# Patient Record
Sex: Male | Born: 1980 | Hispanic: Yes | Marital: Single | Smoking: Never smoker
Health system: Southern US, Community
[De-identification: ages and names within clinical notes are randomized; demographics above are authoritative.]

## PROBLEM LIST (undated history)

## (undated) DIAGNOSIS — I10 Essential (primary) hypertension: Secondary | ICD-10-CM

## (undated) DIAGNOSIS — F410 Panic disorder [episodic paroxysmal anxiety] without agoraphobia: Secondary | ICD-10-CM

---

## 2018-04-16 ENCOUNTER — Emergency Department (HOSPITAL_COMMUNITY): Payer: Self-pay

## 2018-04-16 ENCOUNTER — Emergency Department (HOSPITAL_COMMUNITY)
Admission: EM | Admit: 2018-04-16 | Discharge: 2018-04-16 | Disposition: A | Discharge status: Home / Self Care | Payer: Self-pay | Attending: Emergency Medicine | Admitting: Emergency Medicine

## 2018-04-16 ENCOUNTER — Encounter (HOSPITAL_COMMUNITY): Payer: Self-pay | Admitting: Emergency Medicine

## 2018-04-16 DIAGNOSIS — R002 Palpitations: Secondary | ICD-10-CM | POA: Insufficient documentation

## 2018-04-16 LAB — CBC
HEMATOCRIT: 45.8 % (ref 39.0–52.0)
Hemoglobin: 14.8 g/dL (ref 13.0–17.0)
MCH: 29.1 pg (ref 26.0–34.0)
MCHC: 32.3 g/dL (ref 30.0–36.0)
MCV: 90.2 fL (ref 78.0–100.0)
Platelets: 320 10*3/uL (ref 150–400)
RBC: 5.08 MIL/uL (ref 4.22–5.81)
RDW: 12.3 % (ref 11.5–15.5)
WBC: 6.4 10*3/uL (ref 4.0–10.5)

## 2018-04-16 LAB — I-STAT TROPONIN, ED: Troponin i, poc: 0.01 ng/mL (ref 0.00–0.08)

## 2018-04-16 LAB — BASIC METABOLIC PANEL
Anion gap: 8 (ref 5–15)
BUN: 18 mg/dL (ref 6–20)
CO2: 22 mmol/L (ref 22–32)
Calcium: 8.7 mg/dL — ABNORMAL LOW (ref 8.9–10.3)
Chloride: 108 mmol/L (ref 98–111)
Creatinine, Ser: 1.05 mg/dL (ref 0.61–1.24)
GFR calc Af Amer: >60 mL/min (ref >60)
GLUCOSE: 119 mg/dL — AB (ref 70–99)
POTASSIUM: 3.9 mmol/L (ref 3.5–5.1)
Sodium: 138 mmol/L (ref 135–145)

## 2018-04-16 LAB — D-DIMER, QUANTITATIVE: D-Dimer, Quant: 0.39 ug/mL-FEU (ref 0.00–0.50)

## 2018-04-16 MED ORDER — OMEPRAZOLE 20 MG PO CPDR: 20.0000 mg | DELAYED_RELEASE_CAPSULE | Freq: Every day | ORAL | 1 refills | Status: AC

## 2018-04-16 NOTE — Discharge Instructions (Signed)
Su trabajo hoy fue tranquilizador. Le recomendamos que realice un seguimiento de la cardiologa si los sntomas continan. Le han recetado Prilosec. Tome [employer]esto todos los das para ver si ayuda a Paramedicaliviar sus sntomas. Usted puede regresar al ED para cualquier sntoma nuevo o preocupante.  Your work up today was reassuring. We advise that you follow up with cardiology if symptoms continue. You have been prescribed Prilosec. Take this daily to see if it helps alleviate your symptoms. You may return to the ED for any new or concerning symptoms.

## 2018-04-16 NOTE — ED Notes (Signed)
The pt walked to the room and then to the br  No sob  Tolerated well

## 2018-04-16 NOTE — ED Notes (Signed)
The pt had chest pain earlier  bp is high he does not take bp med

## 2018-04-16 NOTE — ED Triage Notes (Signed)
Pt presents with feeling dizzy, sob, and having high BP which he has no known hx of; pt denies CP, LOC, or n/v; pt denies medical problems or surgeries or allergies

## 2018-04-16 NOTE — ED Provider Notes (Signed)
[facility] EMERGENCY DEPARTMENT Provider Note   CSN: [id number] Arrival date & time: 04/16/18  0156    History   Chief Complaint Chief Complaint  Patient presents with  . Dizziness  . Shortness of Breath    HPI Jonathan Mullins is a 37 y.o. male.  The patient is a 37 year old male who presents to the emergency department for evaluation of shortness of breath and palpitations.  He states that he has been experiencing palpitations and shortness of breath waking him from sleep almost every evening.  He does subjectively feel symptoms to be worse after he has had a large meal before bed.  He denies any known alleviating factors of his symptoms.  He feels slightly lightheaded when his symptoms are present, but has not experienced any syncope.  Denies any chest pain, abdominal pain, diaphoresis, vomiting, leg swelling.  No recent surgeries, hospitalizations, travel.  No history of ACS, diabetes, hypertension, dyslipidemia, tobacco use, or family history of ACS.  The history is provided by the patient. A language interpreter was used ([employer]).  Dizziness  Associated symptoms: shortness of breath   Shortness of Breath     History reviewed. No pertinent past medical history.  There are no active problems to display for this patient.   History reviewed. No pertinent surgical history.      Home Medications    Prior to Admission medications   Medication Sig Start Date End Date Taking? Authorizing Provider  omeprazole (PRILOSEC) 20 MG capsule Take 1 capsule (20 mg total) by mouth daily. 04/16/18   Antony Madura, PA-C    Family History History reviewed. No pertinent family history.  Social History Social History   Tobacco Use  . Smoking status: Never Smoker  Substance Use Topics  . Alcohol use: Not Currently  . Drug use: Not Currently     Allergies   Patient has no known allergies.   Review of Systems Review of Systems  Respiratory: Positive for  shortness of breath.   Neurological: Positive for dizziness.  Ten systems reviewed and are negative for acute change, except as noted in the HPI.    Physical Exam Updated Vital Signs BP 118/75   Pulse 76   Temp 98.2 F (36.8 C) (Oral)   Resp (!) 21   Ht 5' 8.11" (1.73 m)   SpO2 96%   Physical Exam  Constitutional: He is oriented to person, place, and time. He appears well-developed and well-nourished. No distress.  Nontoxic appearing and in NAD  HENT:  Head: Normocephalic and atraumatic.  Eyes: Conjunctivae and EOM are normal. No scleral icterus.  Neck: Normal range of motion.  Cardiovascular: Normal rate, regular rhythm and intact distal pulses.  Pulmonary/Chest: Effort normal. No stridor. No respiratory distress.  Respirations even and unlabored. SpO2 92-98% while in exam room.  Musculoskeletal: Normal range of motion.  No BLE edema.  Neurological: He is alert and oriented to person, place, and time. He exhibits normal muscle tone. Coordination normal.  Skin: Skin is warm and dry. No rash noted. He is not diaphoretic. No erythema. No pallor.  Psychiatric: He has a normal mood and affect. His behavior is normal.  Nursing note and vitals reviewed.    ED Treatments / Results  Labs (all labs ordered are listed, but only abnormal results are displayed) Labs Reviewed  BASIC METABOLIC PANEL - Abnormal; Notable for the following components:      Result Value   Glucose, Bld 119 (*)    Calcium 8.7 (*)  All other components within normal limits  CBC  D-DIMER, QUANTITATIVE (NOT AT [facility])  I-STAT TROPONIN, ED    EKG EKG Interpretation  Date/Time:  Monday April 16 2018 02:06:10 EDT Ventricular Rate:  90 PR Interval:  146 QRS Duration: 86 QT Interval:  352 QTC Calculation: 430 R Axis:   0 Text Interpretation:  Normal sinus rhythm Inferior Qwaves, likely normal variant Abnormal ECG Confirmed by Geoffery LyonseLo, Douglas ([id number]) on 04/16/2018 5:37:14 AM   Radiology Dg Chest 2  View  Result Date: 04/16/2018 CLINICAL DATA:  Acute onset of dizziness, shortness of breath and high blood pressure. EXAM: CHEST - 2 VIEW COMPARISON:  None. FINDINGS: The lungs are well-aerated and clear. There is no evidence of focal opacification, pleural effusion or pneumothorax. The heart is borderline normal in size. No acute osseous abnormalities are seen. IMPRESSION: No acute cardiopulmonary process seen. Electronically Signed   By: Roanna RaiderJeffery  Chang M.D.   On: 04/16/2018 02:39    Procedures Procedures (including critical care time)  Medications Ordered in ED Medications - No data to display   Initial Impression / Assessment and Plan / ED Course  I have reviewed the triage vital signs and the nursing notes.  Pertinent labs & imaging results that were available during my care of the patient were reviewed by me and considered in my medical decision making (see chart for details).     Patient presents to the emergency department for evaluation of palpitations and SOB.  Symptoms wake the patient from sleep at night regularly.  He has no symptoms during the day or with exertion.  Low suspicion for emergent cardiac etiology given reassuring workup today.  EKG is nonischemic and troponin negative.  Patient has a heart score of 0 consistent with low risk of acute coronary event.  Chest x-ray without evidence of mediastinal widening to suggest dissection.  No pneumothorax, pneumonia, pleural effusion.  Pulmonary embolus further considered; however, patient without tachycardia, tachypnea, dyspnea, hypoxia.  D dimer today is negative.  Patient notes that symptoms are slightly worse after a large meal.  Question whether reflux may be a component to his symptoms.  Have also discussed the possibility of an arrhythmia such as SVT or atrial fibrillation.  The patient has had no evidence of that while in the ED.  Will refer to cardiology as he would benefit from a Holter monitor if symptoms continue.  Return  precautions discussed and provided. Patient discharged in stable condition with no unaddressed concerns.   Final Clinical Impressions(s) / ED Diagnoses   Final diagnoses:  Palpitations    ED Discharge Orders        Ordered    omeprazole (PRILOSEC) 20 MG capsule  Daily     04/16/18 0532       Antony MaduraHumes, Lesslie Mossa, PA-C 04/16/18 0542    Geoffery Lyonselo, Douglas, MD 04/16/18 [phone]

## 2018-05-05 ENCOUNTER — Emergency Department (HOSPITAL_COMMUNITY)
Admission: EM | Admit: 2018-05-05 | Discharge: 2018-05-06 | Disposition: A | Discharge status: Home / Self Care | Payer: Self-pay | Attending: Emergency Medicine | Admitting: Emergency Medicine

## 2018-05-05 ENCOUNTER — Other Ambulatory Visit: Payer: Self-pay

## 2018-05-05 DIAGNOSIS — J351 Hypertrophy of tonsils: Secondary | ICD-10-CM | POA: Insufficient documentation

## 2018-05-05 DIAGNOSIS — R0602 Shortness of breath: Secondary | ICD-10-CM | POA: Insufficient documentation

## 2018-05-05 NOTE — ED Triage Notes (Signed)
Patient c/o swollen tonsils and not being able to breathe.

## 2018-05-06 ENCOUNTER — Emergency Department (HOSPITAL_COMMUNITY): Payer: Self-pay

## 2018-05-06 LAB — CBC
HCT: 48.5 % (ref 39.0–52.0)
Hemoglobin: 15.9 g/dL (ref 13.0–17.0)
MCH: 29.7 pg (ref 26.0–34.0)
MCHC: 32.8 g/dL (ref 30.0–36.0)
MCV: 90.5 fL (ref 78.0–100.0)
PLATELETS: 321 10*3/uL (ref 150–400)
RBC: 5.36 MIL/uL (ref 4.22–5.81)
RDW: 12.5 % (ref 11.5–15.5)
WBC: 7.5 10*3/uL (ref 4.0–10.5)

## 2018-05-06 LAB — BASIC METABOLIC PANEL
Anion gap: 13 (ref 5–15)
BUN: 8 mg/dL (ref 6–20)
CO2: 21 mmol/L — ABNORMAL LOW (ref 22–32)
CREATININE: 0.92 mg/dL (ref 0.61–1.24)
Calcium: 9.3 mg/dL (ref 8.9–10.3)
Chloride: 105 mmol/L (ref 98–111)
GFR calc Af Amer: >60 mL/min (ref >60)
Glucose, Bld: 91 mg/dL (ref 70–99)
POTASSIUM: 3.7 mmol/L (ref 3.5–5.1)
Sodium: 139 mmol/L (ref 135–145)

## 2018-05-06 LAB — D-DIMER, QUANTITATIVE (NOT AT ARMC): D DIMER QUANT: 0.33 ug{FEU}/mL (ref 0.00–0.50)

## 2018-05-06 LAB — GROUP A STREP BY PCR: GROUP A STREP BY PCR: NOT DETECTED

## 2018-05-06 LAB — BRAIN NATRIURETIC PEPTIDE: B Natriuretic Peptide: 29.4 pg/mL (ref 0.0–100.0)

## 2018-05-06 MED ORDER — DEXAMETHASONE SODIUM PHOSPHATE 10 MG/ML IJ SOLN
10.0000 mg | Freq: Once | INTRAMUSCULAR | Status: AC
  Administered 2018-05-06: 10 mg via INTRAMUSCULAR
  Filled 2018-05-06: qty 1

## 2018-05-06 NOTE — ED Provider Notes (Signed)
[facility] Provider [facility]   CSN: [id number] Arrival date & time: 05/05/18  2317     History   Chief Complaint Chief Complaint  Patient presents with  . Sore Throat    HPI Margarito Linerlfredo Rupnow is a 37 y.o. male.  HPI Translation services utilized for this visit.  37 year old male comes in with chief complaint of redness of breath.  Patient has no medical history. He reports that over the past several days, he has been having shortness of breath.  His shortness of breath is typically present at nighttime and wakes him up from his sleep.  Patient denies any associated chest pain.  On occasion patient has felt like he might faint, which got him concerned.  Patient also reports that he has some exertional dyspnea.  Finally, patient also informs us that he feels like his tonsils are enlarged.  Patient does not smoke or use any drugs.  He has no family history of premature CAD or sudden deaths.  Patient's girlfriend states that patient does not snore at nighttime.    No past medical history on file.  There are no active problems to display for this patient.   No past surgical history on file.      Home Medications    Prior to Admission medications   Medication Sig Start Date End Date Taking? Authorizing Provider  omeprazole (PRILOSEC) 20 MG capsule Take 1 capsule (20 mg total) by mouth daily. Patient not taking: Reported on 05/06/2018 04/16/18   Antony MaduraHumes, Kelly, PA-C    Family History No family history on file.  Social History Social History   Tobacco Use  . Smoking status: Never Smoker  Substance Use Topics  . Alcohol use: Not Currently  . Drug use: Not Currently     Allergies   Patient has no known allergies.   Review of Systems Review of Systems  Constitutional: Positive for activity change.  Respiratory: Positive for shortness of breath.   Cardiovascular: Negative for chest pain.  Gastrointestinal: Negative for blood in  stool.  Neurological: Negative for syncope.     Physical Exam Updated Vital Signs BP 107/79   Pulse 61   Temp 98.7 F (37.1 C) (Oral)   Resp (!) 22   Ht 5\' 8"  (1.727 m)   Wt 104.8 kg   SpO2 99%   BMI 35.12 kg/m   Physical Exam  Constitutional: He is oriented to person, place, and time. He appears well-developed.  HENT:  Head: Atraumatic.  Mouth/Throat: No tonsillar abscesses.  Bilateral tonsillar enlargement No exudates, tonsils are not in contact with one another  Neck: Neck supple.  Cardiovascular: Normal rate.  Pulmonary/Chest: Effort normal.  Neurological: He is alert and oriented to person, place, and time.  Skin: Skin is warm.  Nursing note and vitals reviewed.    ED Treatments / Results  Labs (all labs ordered are listed, but only abnormal results are displayed) Labs Reviewed  BASIC METABOLIC PANEL - Abnormal; Notable for the following components:      Result Value   CO2 21 (*)    All other components within normal limits  GROUP A STREP BY PCR  D-DIMER, QUANTITATIVE (NOT AT [facility])  BRAIN NATRIURETIC PEPTIDE  CBC    EKG EKG Interpretation  Date/Time:  Saturday May 05 2018 23:25:35 EDT Ventricular Rate:  91 PR Interval:  138 QRS Duration: 90 QT Interval:  360 QTC Calculation: 442 R Axis:   17 Text Interpretation:  Normal sinus rhythm Normal  ECG infeiror q waves [id number] resolved Confirmed by Derwood Kaplan [phone]) on 05/06/2018 1:00:12 AM   Radiology Dg Chest 2 View  Result Date: 05/06/2018 CLINICAL DATA:  Shortness of breath. EXAM: CHEST - 2 VIEW COMPARISON:  Radiographs 04/16/2018 FINDINGS: The cardiomediastinal contours are normal, heart size upper normal unchanged likely related to degree of inspiration. The lungs are clear. Pulmonary vasculature is normal. No consolidation, pleural effusion, or pneumothorax. No acute osseous abnormalities are seen. IMPRESSION: Negative radiographs of the chest. Electronically Signed   By: Rubye Oaks M.D.    On: 05/06/2018 00:25    Procedures Procedures (including critical care time)  Medications Ordered in ED Medications  dexamethasone (DECADRON) injection 10 mg (10 mg Intramuscular Given 05/06/18 0321)     Initial Impression / Assessment and Plan / ED Course  I have reviewed the triage vital signs and the nursing notes.  Pertinent labs & imaging results that were available during my care of the patient were reviewed by me and considered in my medical decision making (see chart for details).     37 year old male comes in with chief complaint of shortness of breath. He is having orthopnea-like symptoms and also exertional dyspnea.  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  He was seen in the ER early in the month with similar symptoms. BNP and d-dimer are negative.  Hemoglobin is normal. We do notice that his tonsils are enlarged.  I think this might be a mechanical obstruction.  Girlfriend however states that patient does not snore at nighttime.  Plan is for [place] to have patient follow-up with the ENT. If ENT clears the patient then he will see the cardiologist.  Strict ER return precautions have been discussed, and the patient has been asked to be sleeping in a reclined position at night.  Final Clinical Impressions(s) / ED Diagnoses   Final diagnoses:  Tonsillar enlargement  Shortness of breath    ED Discharge Orders    None       Derwood Kaplan, MD 05/06/18 [phone]

## 2018-05-06 NOTE — Discharge Instructions (Addendum)
As discussed, we noticed that he had tonsillar swelling.  Please call the ENT doctors for the earliest appointment. See the cardiologist only if you are cleared by the ENT doctors.  Return to the ER if your symptoms get worse.

## 2018-05-10 ENCOUNTER — Encounter (HOSPITAL_COMMUNITY): Payer: Self-pay | Admitting: Emergency Medicine

## 2018-05-10 ENCOUNTER — Emergency Department (HOSPITAL_COMMUNITY)
Admission: EM | Admit: 2018-05-10 | Discharge: 2018-05-11 | Disposition: A | Discharge status: Home / Self Care | Payer: Self-pay | Attending: Emergency Medicine | Admitting: Emergency Medicine

## 2018-05-10 DIAGNOSIS — R0602 Shortness of breath: Secondary | ICD-10-CM

## 2018-05-10 DIAGNOSIS — J029 Acute pharyngitis, unspecified: Secondary | ICD-10-CM

## 2018-05-10 DIAGNOSIS — Z79899 Other long term (current) drug therapy: Secondary | ICD-10-CM | POA: Insufficient documentation

## 2018-05-10 DIAGNOSIS — Z77098 Contact with and (suspected) exposure to other hazardous, chiefly nonmedicinal, chemicals: Secondary | ICD-10-CM

## 2018-05-10 DIAGNOSIS — K760 Fatty (change of) liver, not elsewhere classified: Secondary | ICD-10-CM | POA: Insufficient documentation

## 2018-05-10 DIAGNOSIS — F419 Anxiety disorder, unspecified: Secondary | ICD-10-CM | POA: Insufficient documentation

## 2018-05-10 LAB — BASIC METABOLIC PANEL
ANION GAP: 11 (ref 5–15)
BUN: 14 mg/dL (ref 6–20)
CHLORIDE: 106 mmol/L (ref 98–111)
CO2: 21 mmol/L — ABNORMAL LOW (ref 22–32)
Calcium: 9.3 mg/dL (ref 8.9–10.3)
Creatinine, Ser: 1.08 mg/dL (ref 0.61–1.24)
Glucose, Bld: 113 mg/dL — ABNORMAL HIGH (ref 70–99)
POTASSIUM: 3.8 mmol/L (ref 3.5–5.1)
SODIUM: 138 mmol/L (ref 135–145)

## 2018-05-10 LAB — CBC
HEMATOCRIT: 48.4 % (ref 39.0–52.0)
Hemoglobin: 16 g/dL (ref 13.0–17.0)
MCH: 29.6 pg (ref 26.0–34.0)
MCHC: 33.1 g/dL (ref 30.0–36.0)
MCV: 89.6 fL (ref 78.0–100.0)
Platelets: 342 10*3/uL (ref 150–400)
RBC: 5.4 MIL/uL (ref 4.22–5.81)
RDW: 12.3 % (ref 11.5–15.5)
WBC: 8.3 10*3/uL (ref 4.0–10.5)

## 2018-05-10 LAB — I-STAT TROPONIN, ED: Troponin i, poc: 0 ng/mL (ref 0.00–0.08)

## 2018-05-10 NOTE — ED Triage Notes (Addendum)
Pt presents to ED for continuing shortness of breath after being seen twice in the ER in the past week.  Patient states he was exposed to some chemicals (ammonia and chlorox) at work approx 1.5 weeks ago, and has been having shortness of breath so severe he can't sleep at night, eat, or walk long distances.  Patient denies chest pain, denies hx of anxiety, denies recent long travel.

## 2018-05-11 ENCOUNTER — Emergency Department (HOSPITAL_COMMUNITY): Payer: Self-pay

## 2018-05-11 LAB — BRAIN NATRIURETIC PEPTIDE: B Natriuretic Peptide: 7.3 pg/mL (ref 0.0–100.0)

## 2018-05-11 MED ORDER — METHYLPREDNISOLONE SODIUM SUCC 125 MG IJ SOLR
125.0000 mg | Freq: Once | INTRAMUSCULAR | Status: AC
  Administered 2018-05-11: 125 mg via INTRAVENOUS
  Filled 2018-05-11: qty 2

## 2018-05-11 MED ORDER — IOPAMIDOL (ISOVUE-370) INJECTION 76%
INTRAVENOUS | Status: AC
  Filled 2018-05-11: qty 100

## 2018-05-11 MED ORDER — LORAZEPAM 1 MG PO TABS
0.5000 mg | ORAL_TABLET | Freq: Once | ORAL | Status: AC
  Administered 2018-05-11: 0.5 mg via ORAL
  Filled 2018-05-11: qty 1

## 2018-05-11 MED ORDER — IOPAMIDOL (ISOVUE-370) INJECTION 76%
100.0000 mL | Freq: Once | INTRAVENOUS | Status: AC | PRN
  Administered 2018-05-11: 100 mL via INTRAVENOUS

## 2018-05-11 MED ORDER — PREDNISONE 20 MG PO TABS: 40.0000 mg | ORAL_TABLET | Freq: Every day | ORAL | 0 refills | Status: AC

## 2018-05-11 MED ORDER — ALBUTEROL SULFATE (2.5 MG/3ML) 0.083% IN NEBU
5.0000 mg | INHALATION_SOLUTION | Freq: Once | RESPIRATORY_TRACT | Status: AC
  Administered 2018-05-11: 5 mg via RESPIRATORY_TRACT

## 2018-05-11 MED ORDER — DIPHENHYDRAMINE HCL 50 MG/ML IJ SOLN
50.0000 mg | Freq: Once | INTRAMUSCULAR | Status: AC
  Administered 2018-05-11: 50 mg via INTRAVENOUS
  Filled 2018-05-11: qty 1

## 2018-05-11 NOTE — Discharge Instructions (Addendum)
1. Medications: Albuterol inhaler as needed for shortness of breath, prednisone 1 tab per day for 5 days, usual home medications 2. Treatment: rest, drink plenty of fluids,  3. Follow Up: Please followup with your ENT doctor today and schedule an appointment with the pulmonologist in 3-5 days; Please return to the ER for new or worsening symptoms   1. Medicamentos: inhalador de albuterol segn sea necesario para la dificultad para respirar, prednisona 1 tableta por da durante 5 das, medicamentos caseros habituales 2. Tratamiento: descansar, beber muchos lquidos, 3. Seguimiento: haga un seguimiento con su otorrinolaringlogo hoy y programe una cita con Veterinary surgeonel neumlogo en 3-5 das; Regrese a la sala de emergencias para Engineer, manufacturingdetectar sntomas nuevos o que empeoren

## 2018-05-11 NOTE — ED Provider Notes (Signed)
[facility] EMERGENCY DEPARTMENT Provider Note   CSN: [id number] Arrival date & time: 05/10/18  2212     History   Chief Complaint Chief Complaint  Patient presents with  . Shortness of Breath    HPI Jonathan Mullins is a 37 y.o. male with a hx of no major medical problems presents to the Emergency Department complaining of gradual, persistent, progressively worsening SOB onset 1.5 weeks ago.  Pt reports he was exposed to Clorox and ammonia at work. Pt reports he is unable to sleep, walk or eat due to the SOB.  Pt reports SOB is worse with laying flat and his breathing is improved with sitting or standing.  Pt reports a little mucous production with his persistent cough.  Pt denies fever, chills, headache, neck pain, chest pain, abd pain, N/V/D, weakness, dizziness, syncope.  He reports a persistent sore throat as well.  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  Pt reports he has been seen 3x for this and has an ENT appointment for tomorrow.  He was given an albuterol MDI to use at home over the last several days, but this has not helped much.     The history is provided by the patient, medical records and the spouse. No language interpreter was used.    History reviewed. No pertinent past medical history.  There are no active problems to display for this patient.   History reviewed. No pertinent surgical history.      Home Medications    Prior to Admission medications   Medication Sig Start Date End Date Taking? Authorizing Provider  omeprazole (PRILOSEC) 20 MG capsule Take 1 capsule (20 mg total) by mouth daily. Patient not taking: Reported on 05/06/2018 04/16/18   Antony Madura, PA-C  predniSONE (DELTASONE) 20 MG tablet Take 2 tablets (40 mg total) by mouth daily. 05/11/18    Maverick Dieudonne, Dahlia Client, PA-C    Family History History reviewed. No pertinent family history.  Social History Social History   Tobacco Use  . Smoking status: Never Smoker  . Smokeless tobacco: Never Used  Substance Use Topics  . Alcohol use: Not Currently  . Drug use: Not Currently     Allergies   Iodine   Review of Systems Review of Systems  Constitutional: Negative for appetite change, diaphoresis, fatigue, fever and unexpected weight change.  HENT: Positive for sore throat and trouble swallowing. Negative for mouth sores.   Eyes: Negative for visual disturbance.  Respiratory: Positive for cough and shortness of breath. Negative for chest tightness and wheezing.   Cardiovascular: Negative for chest pain.  Gastrointestinal: Negative for abdominal pain, constipation, diarrhea, nausea and vomiting.  Endocrine: Negative for polydipsia, polyphagia and polyuria.  Genitourinary: Negative for dysuria, frequency, hematuria and urgency.  Musculoskeletal: Negative for back pain and neck stiffness.  Skin: Negative for rash.  Allergic/Immunologic: Negative for immunocompromised state.  Neurological: Negative for syncope, light-headedness and headaches.  Hematological: Does not bruise/bleed easily.  Psychiatric/Behavioral: Negative for sleep disturbance. The patient is nervous/anxious.      Physical Exam Updated Vital Signs BP 118/85   Pulse 84   Temp 98.2 F (36.8 C) (Oral)   Resp (!) 24   SpO2 92%    Physical Exam  Constitutional: He appears well-developed and well-nourished. No distress.  Awake,  alert, nontoxic appearance  HENT:  Head: Normocephalic and atraumatic.  Right Ear: Tympanic membrane, external ear and ear canal normal.  Left Ear: Tympanic membrane, external ear and ear canal normal.  Nose: Nose normal. No mucosal edema or rhinorrhea.  Mouth/Throat: Uvula is midline and mucous membranes are normal. Mucous membranes are not dry. No trismus in the jaw. No uvula  swelling. Posterior oropharyngeal edema and posterior oropharyngeal erythema present. No oropharyngeal exudate or tonsillar abscesses.  Eyes: Conjunctivae are normal. No scleral icterus.  Neck: Normal range of motion, full passive range of motion without pain and phonation normal. Neck supple. No tracheal tenderness, no spinous process tenderness and no muscular tenderness present. No neck rigidity. No erythema and normal range of motion present. No Brudzinski's sign and no Kernig's sign noted.  Range of motion without pain  No midline or paraspinal tenderness Normal phonation No stridor Handling secretions without difficulty No nuchal rigidity or meningeal signs  Cardiovascular: Normal rate, regular rhythm, normal heart sounds and intact distal pulses.  Pulses:      Radial pulses are 2+ on the right side, and 2+ on the left side.       Posterior tibial pulses are 2+ on the right side, and 2+ on the left side.  Pulmonary/Chest: Effort normal and breath sounds normal. No stridor. No respiratory distress. He has no decreased breath sounds. He has no wheezes.  Equal chest expansion, clear and equal breath sounds without focal wheezes, rhonchi or rales  Abdominal: Soft. Bowel sounds are normal. He exhibits no mass. There is no tenderness. There is no rebound and no guarding.  Musculoskeletal: Normal range of motion. He exhibits no edema.  No calf swelling or tenderness  Lymphadenopathy:       Head (right side): No submental, no submandibular, no tonsillar, no preauricular, no posterior auricular and no occipital adenopathy present.       Head (left side): No submental, no submandibular, no tonsillar, no preauricular, no posterior auricular and no occipital adenopathy present.    He has no cervical adenopathy.       Right cervical: No superficial cervical, no deep cervical and no posterior cervical adenopathy present.      Left cervical: No superficial cervical, no deep cervical and no posterior  cervical adenopathy present.  Neurological: He is alert.  Alert and oriented Moves all extremities without ataxia  Skin: Skin is warm and dry. He is not diaphoretic.  Psychiatric: His mood appears anxious.  Pt is clearly very anxious  Nursing note and vitals reviewed.    ED Treatments / Results  Labs (all labs ordered are listed, but only abnormal results are displayed) Labs Reviewed  BASIC METABOLIC PANEL - Abnormal; Notable for the following components:      Result Value   CO2 21 (*)    Glucose, Bld 113 (*)    All other components within normal limits  CBC  BRAIN NATRIURETIC PEPTIDE  I-STAT TROPONIN, ED    EKG EKG Interpretation  Date/Time:  Thursday May 10 2018 22:25:58 EDT Ventricular Rate:  79 PR Interval:  146 QRS Duration: 84 QT Interval:  382 QTC Calculation: 438 R Axis:   -12 Text Interpretation:  Normal sinus rhythm Normal ECG Confirmed by Zadie Rhine ([id number]) on 05/11/2018 12:19:13 AM   Radiology Ct Soft Tissue Neck W Contrast  Result Date: 05/11/2018 CLINICAL DATA:  37 y/o M; tonsillar swelling, shortness of breath, difficulty swallowing. Chemical exposure 1-1/2 weeks ago. EXAM: CT NECK WITH CONTRAST TECHNIQUE: Multidetector  CT imaging of the neck was performed using the standard protocol following the bolus administration of intravenous contrast. CONTRAST:  100 cc Isovue 370 COMPARISON:  None. FINDINGS: Pharynx and larynx: Normal. No mass or swelling. Salivary glands: No inflammation, mass, or stone. Thyroid: Normal. Lymph nodes: None enlarged or abnormal density. Vascular: Negative. Limited intracranial: Negative. Visualized orbits: Negative. Mastoids and visualized paranasal sinuses: Mild maxillary sinus mucosal thickening. Additional visualized paranasal sinuses and the mastoid air cells are normally aerated. Skeleton: No acute or aggressive process. Upper chest: Please refer to the concurrent CT angiogram of the chest for further evaluation of lungs and  mediastinum. Other: None. IMPRESSION: Mild maxillary sinus mucosal thickening. Otherwise unremarkable CT of the neck. Electronically Signed   By: Mitzi Hansen M.D.   On: 05/11/2018 02:25   Ct Angio Chest Pe W And/or Wo Contrast  Result Date: 05/11/2018 CLINICAL DATA:  37 year old male with shortness of breath. EXAM: CT ANGIOGRAPHY CHEST WITH CONTRAST TECHNIQUE: Multidetector CT imaging of the chest was performed using the standard protocol during bolus administration of intravenous contrast. Multiplanar CT image reconstructions and MIPs were obtained to evaluate the vascular anatomy. CONTRAST:  ISOVUE-370 IOPAMIDOL (ISOVUE-370) INJECTION 76% COMPARISON:  Chest radiograph dated 05/06/2018 FINDINGS: Cardiovascular: Borderline cardiomegaly. No pericardial effusion. The thoracic aorta is unremarkable. There is no CT evidence of pulmonary embolism. Mediastinum/Nodes: No hilar or mediastinal adenopathy. Esophagus and the thyroid gland are grossly unremarkable. No mediastinal fluid collection. Lungs/Pleura: There is a 4 mm right middle lobe subpleural nodule (series 6, image 66) additional 4 mm subpleural nodule noted in the right lower lobe. The lungs are otherwise clear. There is no pleural effusion or pneumothorax. The central airways are patent. Upper Abdomen: Diffuse fatty infiltration of the liver. The visualized upper abdomen is otherwise unremarkable. Musculoskeletal: No chest wall abnormality. No acute or significant osseous findings. Review of the MIP images confirms the above findings. IMPRESSION: 1. No acute intrathoracic pathology. No CT evidence of pulmonary embolism. 2. Fatty liver. 3. Pulmonary subpleural nodules measure up to 4 mm. No follow-up needed if patient is low-risk (and has no known or suspected primary neoplasm). Non-contrast chest CT can be considered in 12 months if patient is high-risk. This recommendation follows the consensus statement: Guidelines for Management of  Incidental Pulmonary Nodules Detected on CT Images: From the Fleischner Society 2017; Radiology 2017; 284:228-243. Electronically Signed   By: Elgie Collard M.D.   On: 05/11/2018 02:19    Procedures Procedures (including critical care time)  Medications Ordered in ED Medications  iopamidol (ISOVUE-370) 76 % injection (has no administration in time range)  LORazepam (ATIVAN) tablet 0.5 mg (has no administration in time range)  albuterol (PROVENTIL) (2.5 MG/3ML) 0.083% nebulizer solution 5 mg (5 mg Nebulization Given 05/11/18 0127)  iopamidol (ISOVUE-370) 76 % injection 100 mL (100 mLs Intravenous Contrast Given 05/11/18 0150)  diphenhydrAMINE (BENADRYL) injection 50 mg (50 mg Intravenous Given 05/11/18 0216)  methylPREDNISolone sodium succinate (SOLU-MEDROL) 125 mg/2 mL injection 125 mg (125 mg Intravenous Given 05/11/18 0216)     Initial Impression / Assessment and Plan / ED Course  I have reviewed the triage vital signs and the nursing notes.  Pertinent labs & imaging results that were available during my care of the patient were reviewed by me and considered in my medical decision making (see chart for details).  Clinical Course as of May 12 519  [facility] May 11, 2018  4098 No Leukocytosis  WBC: 8.3 [HM]  0515 Negative troponin  Troponin  i, poc: 0.00 [HM]  0515 Normal BNP  B Natriuretic Peptide: 7.3 [HM]  0515 No tachycardia  Pulse Rate: 89 [HM]  0515 No hypoxia  SpO2: 96 % [HM]    Clinical Course User Index [HM] Aayden Cefalu, Dahlia ClientHannah, PA-C    Patient presents with complaints of shortness of breath.  Record review shows that he has been evaluated 3 times in the last week and a half for similar symptoms.  On clinical exam he does have some irritation and mild edema to his tonsils but no exudate.  No stridor.  He is handling secretions without difficulty.  He is without tachycardia, hypotension or hypoxia.  Lung sounds are clear and equal without wheezing.  It is very anxious,  stating that he cannot breathe.  He was given an albuterol nebulizer with decreased anxiety.  His lung sounds remain clear afterwards.  He was additionally given Solu-Medrol.  CT scan of his neck and chest are without acute abnormalities.  There is no evidence of retropharyngeal abscess, peritonsillar abscess, pulmonary embolism, pulmonary edema, pneumonitis or pleural effusion.    Labs are reassuring.  No leukocytosis to suggest infection.  Patient has been largely sedentary at home over the last few days but is without tachycardia.  Less likely to be pulmonary embolism but will obtain a CT scan.  No elevation in BNP to suggest heart failure.  Chest x-rays over the last week have been without pulmonary edema or evidence of pneumonia.  5:17 AM He remains well-appearing without respiratory distress.  No accessory muscle usage.  Lung sounds remain clear and equal area and patient remains without hypoxia.  I have watched patient ambulate here in the emergency department without respiratory distress.  Long discussion with patient and wife.  He is very anxious about feeling short of breath.  He currently has an appointment with ear nose and throat earlier today.  I will refer him to neurology for further evaluation as well.  Patient's wife request that patient be given a prescription for medicine to help him sleep as she believes this is anxiety.  Patient has been given a very small dose of Ativan here in the emergency department but I will not prescribe this to go home with.  Additionally, he will be discharged home with his already prescribed inhaler and I will give a 5-day burst of prednisone.  Gust reasons to return immediately to the emergency department.  Patient and wife state understanding and are in agreement with this plan.  The patient was discussed with and seen by Dr. Bebe ShaggyWickline who agrees with the treatment plan.   Final Clinical Impressions(s) / ED Diagnoses   Final diagnoses:  Shortness  of breath  Sore throat  Chemical exposure    ED Discharge Orders         Ordered    predniSONE (DELTASONE) 20 MG tablet  Daily     05/11/18 0509           Devonte Migues, Dahlia ClientHannah, PA-C 05/11/18 [id number]    Zadie RhineWickline, Donald, MD 05/11/18 [phone]

## 2018-05-11 NOTE — ED Notes (Signed)
Patient transported to X-ray 

## 2018-06-20 ENCOUNTER — Other Ambulatory Visit (HOSPITAL_BASED_OUTPATIENT_CLINIC_OR_DEPARTMENT_OTHER): Payer: Self-pay

## 2018-06-20 DIAGNOSIS — R0683 Snoring: Secondary | ICD-10-CM

## 2018-06-29 ENCOUNTER — Ambulatory Visit (HOSPITAL_BASED_OUTPATIENT_CLINIC_OR_DEPARTMENT_OTHER): Payer: Self-pay | Attending: Otolaryngology

## 2018-10-09 ENCOUNTER — Encounter (HOSPITAL_COMMUNITY): Payer: Self-pay | Admitting: Emergency Medicine

## 2018-10-09 ENCOUNTER — Other Ambulatory Visit: Payer: Self-pay

## 2018-10-09 ENCOUNTER — Emergency Department (HOSPITAL_COMMUNITY)
Admission: EM | Admit: 2018-10-09 | Discharge: 2018-10-10 | Disposition: A | Discharge status: Home / Self Care | Payer: PRIVATE HEALTH INSURANCE | Attending: Emergency Medicine | Admitting: Emergency Medicine

## 2018-10-09 DIAGNOSIS — R0981 Nasal congestion: Secondary | ICD-10-CM | POA: Diagnosis not present

## 2018-10-09 DIAGNOSIS — J020 Streptococcal pharyngitis: Secondary | ICD-10-CM | POA: Insufficient documentation

## 2018-10-09 DIAGNOSIS — R05 Cough: Secondary | ICD-10-CM | POA: Diagnosis present

## 2018-10-09 DIAGNOSIS — R059 Cough, unspecified: Secondary | ICD-10-CM

## 2018-10-09 NOTE — ED Triage Notes (Signed)
C/o generalized body aches, cough, nasal congestion, sore throat, and fever x 2 days.

## 2018-10-10 ENCOUNTER — Emergency Department (HOSPITAL_COMMUNITY): Payer: PRIVATE HEALTH INSURANCE

## 2018-10-10 LAB — GROUP A STREP BY PCR: Group A Strep by PCR: DETECTED — AB

## 2018-10-10 MED ORDER — PENICILLIN G BENZATHINE 1200000 UNIT/2ML IM SUSP
1.2000 10*6.[IU] | Freq: Once | INTRAMUSCULAR | Status: AC
  Administered 2018-10-10: 1.2 10*6.[IU] via INTRAMUSCULAR
  Filled 2018-10-10: qty 2

## 2018-10-10 MED ORDER — BENZONATATE 100 MG PO CAPS: 100.0000 mg | ORAL_CAPSULE | Freq: Three times a day (TID) | ORAL | 0 refills | Status: AC

## 2018-10-10 MED ORDER — FLUTICASONE PROPIONATE 50 MCG/ACT NA SUSP: 1.0000 | Freq: Every day | NASAL | 2 refills | Status: AC

## 2018-10-10 NOTE — ED Notes (Signed)
Patient verbalizes understanding of discharge instructions. Opportunity for questioning and answers were provided. Armband removed by staff, pt discharged from ED ambulatory.   

## 2018-10-10 NOTE — Discharge Instructions (Signed)
You have been treated for strep throat.  Take the prescribed medication as directed. Follow-up with your primary care doctor. Return to the ED for new or worsening symptoms.

## 2018-10-10 NOTE — ED Provider Notes (Signed)
[facility] EMERGENCY DEPARTMENT Provider Note   CSN: [id number] Arrival date & time: 10/09/18  2041     History   Chief Complaint Chief Complaint  Patient presents with  . flu like symptoms    HPI Jonathan Mullins is a 38 y.o. male.  The history is provided by the patient and medical records.    38 year old male presenting to the ED with URI symptoms over the past week.  He reports it initially started as a runny nose, but has since progressed into productive cough, sore throat, body aches, fever and fatigue.  He denies nausea, vomiting, or diarrhea.  He has been able to eat/drink but does report pain with swallowing.  Has had trouble sleeping due to coughing fits at night.  He occasionally feels SOB but denies this currently.  No chest pain.  No sick contacts at home, but has had them at work.  He did receive a flu vaccine this year.  He has been using OTC cold medication without relief.  History reviewed. No pertinent past medical history.  There are no active problems to display for this patient.   History reviewed. No pertinent surgical history.      Home Medications    Prior to Admission medications   Medication Sig Start Date End Date Taking? Authorizing Provider  omeprazole (PRILOSEC) 20 MG capsule Take 1 capsule (20 mg total) by mouth daily. Patient not taking: Reported on 05/06/2018 04/16/18   Antony Madura, PA-C  predniSONE (DELTASONE) 20 MG tablet Take 2 tablets (40 mg total) by mouth daily. 05/11/18   Muthersbaugh, Dahlia Client, PA-C    Family History No family history on file.  Social History Social History   Tobacco Use  . Smoking status: Never Smoker  . Smokeless tobacco: Never Used  Substance Use Topics  . Alcohol use: Not Currently  . Drug use: Not Currently     Allergies   Iodine   Review of Systems Review of Systems  Constitutional: Positive for fever.  HENT: Positive for congestion and rhinorrhea.   Respiratory: Positive for  cough.   All other systems reviewed and are negative.    Physical Exam Updated Vital Signs BP 132/84   Pulse 92   Temp 98.4 F (36.9 C) (Oral)   Resp 18   SpO2 96%   Physical Exam Vitals signs and nursing note reviewed.  Constitutional:      Appearance: He is well-developed.  HENT:     Head: Normocephalic and atraumatic.     Right Ear: Tympanic membrane and ear canal normal.     Left Ear: Tympanic membrane and ear canal normal.     Nose: Congestion and rhinorrhea present. Rhinorrhea is clear.     Mouth/Throat:     Lips: Pink.     Mouth: Mucous membranes are moist.     Pharynx: Oropharynx is clear.     Comments: Tonsils enlarged bilaterally but no exudates; uvula midline without evidence of peritonsillar abscess; handling secretions appropriately; no difficulty swallowing or speaking; normal phonation without stridor Eyes:     Conjunctiva/sclera: Conjunctivae normal.     Pupils: Pupils are equal, round, and reactive to light.  Neck:     Musculoskeletal: Normal range of motion.  Cardiovascular:     Rate and Rhythm: Normal rate and regular rhythm.     Heart sounds: Normal heart sounds.  Pulmonary:     Effort: Pulmonary effort is normal.     Breath sounds: Normal breath sounds. No decreased breath  sounds, wheezing or rhonchi.  Abdominal:     General: Bowel sounds are normal.     Palpations: Abdomen is soft.  Musculoskeletal: Normal range of motion.  Skin:    General: Skin is warm and dry.  Neurological:     Mental Status: He is alert and oriented to person, place, and time.      ED Treatments / Results  Labs (all labs ordered are listed, but only abnormal results are displayed) Labs Reviewed  GROUP A STREP BY PCR - Abnormal; Notable for the following components:      Result Value   Group A Strep by PCR DETECTED (*)    All other components within normal limits    EKG None  Radiology No results found.  Procedures Procedures (including critical care  time)  Medications Ordered in ED Medications  penicillin g benzathine (BICILLIN LA) 1200000 UNIT/2ML injection 1.2 Million Units (1.2 Million Units Intramuscular Given 10/10/18 0620)     Initial Impression / Assessment and Plan / ED Course  I have reviewed the triage vital signs and the nursing notes.  Pertinent labs & imaging results that were available during my care of the patient were reviewed by me and considered in my medical decision making (see chart for details).  38 y.o. M here with URI symptoms for the past week.  Initially started as nasal congestion/rhinorrhea, now sore throat, cough, fever, etc.  He is afebrile, non-toxic in appearance here.  Does have nasal congestion with clear rhinorrhea.  Tonsillar edema present without exudates, handling secretions well.  Normal phonation without stridor.  Lungs clear without wheezes or rhonchi.  Will obtain rapid strep and CXR.  Rapid strep is positive.  CXR clear.  Patient given bicillin here in ED.  Will d/c home with symptomatic care for cough and nasal congestion.  Can follow-up with PCP.  Return here for any new/acute changes.  Final Clinical Impressions(s) / ED Diagnoses   Final diagnoses:  Strep throat  Nasal congestion  Cough    ED Discharge Orders    None       Garlon HatchetSanders, Kiley Solimine M, PA-C 10/10/18 [id number]    Palumbo, April, MD 10/10/18 [phone]

## 2018-12-13 ENCOUNTER — Emergency Department (HOSPITAL_COMMUNITY)
Admission: EM | Admit: 2018-12-13 | Discharge: 2018-12-14 | Disposition: A | Discharge status: Home / Self Care | Payer: PRIVATE HEALTH INSURANCE | Attending: Emergency Medicine | Admitting: Emergency Medicine

## 2018-12-13 ENCOUNTER — Encounter (HOSPITAL_COMMUNITY): Payer: Self-pay | Admitting: Emergency Medicine

## 2018-12-13 ENCOUNTER — Other Ambulatory Visit: Payer: Self-pay

## 2018-12-13 DIAGNOSIS — F410 Panic disorder [episodic paroxysmal anxiety] without agoraphobia: Secondary | ICD-10-CM | POA: Insufficient documentation

## 2018-12-13 DIAGNOSIS — T887XXA Unspecified adverse effect of drug or medicament, initial encounter: Secondary | ICD-10-CM

## 2018-12-13 DIAGNOSIS — R0602 Shortness of breath: Secondary | ICD-10-CM | POA: Diagnosis present

## 2018-12-13 DIAGNOSIS — I10 Essential (primary) hypertension: Secondary | ICD-10-CM | POA: Diagnosis not present

## 2018-12-13 DIAGNOSIS — Z79899 Other long term (current) drug therapy: Secondary | ICD-10-CM | POA: Diagnosis not present

## 2018-12-13 HISTORY — DX: Essential (primary) hypertension: I10

## 2018-12-13 HISTORY — DX: Panic disorder (episodic paroxysmal anxiety): F41.0

## 2018-12-13 NOTE — ED Triage Notes (Signed)
Pt is Spanish speaking. Pt reports he was at home and he felt [facility] and blurry vision, reports he checked his BP, was 180/100. Pt denies SHOB. Pt BP is 113/90 now. Pt reports he took his Klonopin at 2200. Pt reports his blurry vision has resolved, still feels mild SHOB. Pt in no acute distress in triage.

## 2018-12-14 NOTE — ED Provider Notes (Signed)
[facility] EMERGENCY DEPARTMENT Provider Note  CSN: [id number] Arrival date & time: 12/13/18 2321  Chief Complaint(s) Shortness of Breath  HPI Jonathan Mullins is a 38 y.o. male with a history of hypertension and anxiety who presents to the emergency department with sudden onset of blurry vision, tachycardia and shortness of breath.  He reports that he was watching TV when the episode started.  He reports that he felt a wave come up from his chest up to his head and then back down.  When this happened his vision went blurry for less than a second and return.  He began to notice his elevated heart rate and shortness of breath.  He denied any associated chest pain.  He reports that the episode lasted for several minutes and resolved spontaneously.  States that he is currently asymptomatic.  Denies any recent fevers or infections.  Denies any known sick contacts or exposure to cold.  No associated nausea vomiting.  No abdominal pain.   Patient did report feeling a tired yesterday.  Because of this he began taking over-the-counter cold and flu medicine which includes acetaminophen, dextromethorphan and doxylamine.    HPI  Past Medical History Past Medical History:  Diagnosis Date  . Hypertension   . Panic attacks    There are no active problems to display for this patient.  Home Medication(s) Prior to Admission medications   Medication Sig Start Date End Date Taking? Authorizing Provider  benzonatate (TESSALON) 100 MG capsule Take 1 capsule (100 mg total) by mouth every 8 (eight) hours. 10/10/18   Garlon Hatchet, PA-C  fluticasone (FLONASE) 50 MCG/ACT nasal spray Place 1 spray into both nostrils daily. 10/10/18   Garlon Hatchet, PA-C  omeprazole (PRILOSEC) 20 MG capsule Take 1 capsule (20 mg total) by mouth daily. Patient not taking: Reported on 05/06/2018 04/16/18   Antony Madura, PA-C  predniSONE (DELTASONE) 20 MG tablet Take 2 tablets (40 mg total) by mouth daily. 05/11/18    Muthersbaugh, Boyd Kerbs                                                                                                                                    Past Surgical History History reviewed. No pertinent surgical history. Family History History reviewed. No pertinent family history.  Social History Social History   Tobacco Use  . Smoking status: Never Smoker  . Smokeless tobacco: Never Used  Substance Use Topics  . Alcohol use: Not Currently  . Drug use: Not Currently   Allergies Iodine  Review of Systems Review of Systems All other systems are reviewed and are negative for acute change except as noted in the HPI  Physical Exam Vital Signs  I have reviewed the triage vital signs BP 113/90 (BP Location: Right Arm)   Pulse 96   Temp 98.5 F (36.9 C) (Oral)   Resp 16   SpO2 98%   Physical  Exam Vitals signs reviewed.  Constitutional:      General: He is not in acute distress.    Appearance: He is well-developed. He is not diaphoretic.  HENT:     Head: Normocephalic and atraumatic.     Nose: Nose normal.  Eyes:     General: No scleral icterus.       Right eye: No discharge.        Left eye: No discharge.     Conjunctiva/sclera: Conjunctivae normal.     Pupils: Pupils are equal, round, and reactive to light.  Neck:     Musculoskeletal: Normal range of motion and neck supple.  Cardiovascular:     Rate and Rhythm: Normal rate and regular rhythm.     Heart sounds: No murmur. No friction rub. No gallop.   Pulmonary:     Effort: Pulmonary effort is normal. No respiratory distress.     Breath sounds: Normal breath sounds. No stridor. No rales.  Abdominal:     General: There is no distension.     Palpations: Abdomen is soft.     Tenderness: There is no abdominal tenderness.  Musculoskeletal:        General: No tenderness.  Skin:    General: Skin is warm and dry.     Findings: No erythema or rash.  Neurological:     Mental Status: He is alert and oriented to  person, place, and time.     ED Results and Treatments Labs (all labs ordered are listed, but only abnormal results are displayed) Labs Reviewed - No data to display                                                                                                                       EKG  EKG Interpretation  Date/Time:    Ventricular Rate:    PR Interval:    QRS Duration:   QT Interval:    QTC Calculation:   R Axis:     Text Interpretation:        Radiology No results found. Pertinent labs & imaging results that were available during my care of the patient were reviewed by me and considered in my medical decision making (see chart for details).  Medications Ordered in ED Medications - No data to display  Procedures Procedures  (including critical care time)  Medical Decision Making / ED Course I have reviewed the nursing notes for this encounter and the patient's prior records (if available in EHR or on provided paperwork).    Patient presents with sudden onset of brief episode of blurry vision with tachycardia shortness of breath.  This resolved spontaneously.  Patient is currently asymptomatic.  Denies any fevers or URI symptoms.  No known exposures.  Currently taking over-the-counter cold and flu medicine for "feeling tired.  I believe that this episode is related to medication side effect which caused a panic attack.  EKG was nonischemic and reassuring.  Doubt cardiac etiology.  Doubt pulmonary embolism.  Patient here is afebrile with stable vital signs.  There is no evidence of infectious process at this time.  Instructed to refrain from using over-the-counter medicine unless he develops infectious symptoms.  The patient appears reasonably screened and/or stabilized for discharge and I doubt any other medical condition or other [facility] requiring  further screening, evaluation, or treatment in the ED at this time prior to discharge.  The patient is safe for discharge with strict return precautions.   Final Clinical Impression(s) / ED Diagnoses Final diagnoses:  Panic attack  Medication side effect   Disposition: Discharge  Condition: Good  I have discussed the results, Dx and Tx plan with the patient who expressed understanding and agree(s) with the plan. Discharge instructions discussed at great length. The patient was given strict return precautions who verbalized understanding of the instructions. No further questions at time of discharge.    ED Discharge Orders    None       Follow Up: Primary care provider  Schedule an appointment as soon as possible for a visit  If you do not have a primary care physician, contact [facility] at [phone] for referral      This chart was dictated using voice recognition software.  Despite best efforts to proofread,  errors can occur which can change the documentation meaning.   Nira Conn, MD 12/14/18 [phone]

## 2019-06-19 ENCOUNTER — Other Ambulatory Visit: Payer: Self-pay | Admitting: Otolaryngology

## 2019-06-19 DIAGNOSIS — R0683 Snoring: Secondary | ICD-10-CM

## 2020-01-22 IMAGING — CR DG CHEST 2V
2 series · 2 of 2 positions shown · non-contrast
Comparison: None.

CLINICAL DATA: Acute onset of dizziness, shortness of breath and
high blood pressure.

EXAM:
CHEST - 2 VIEW

[chest pa]
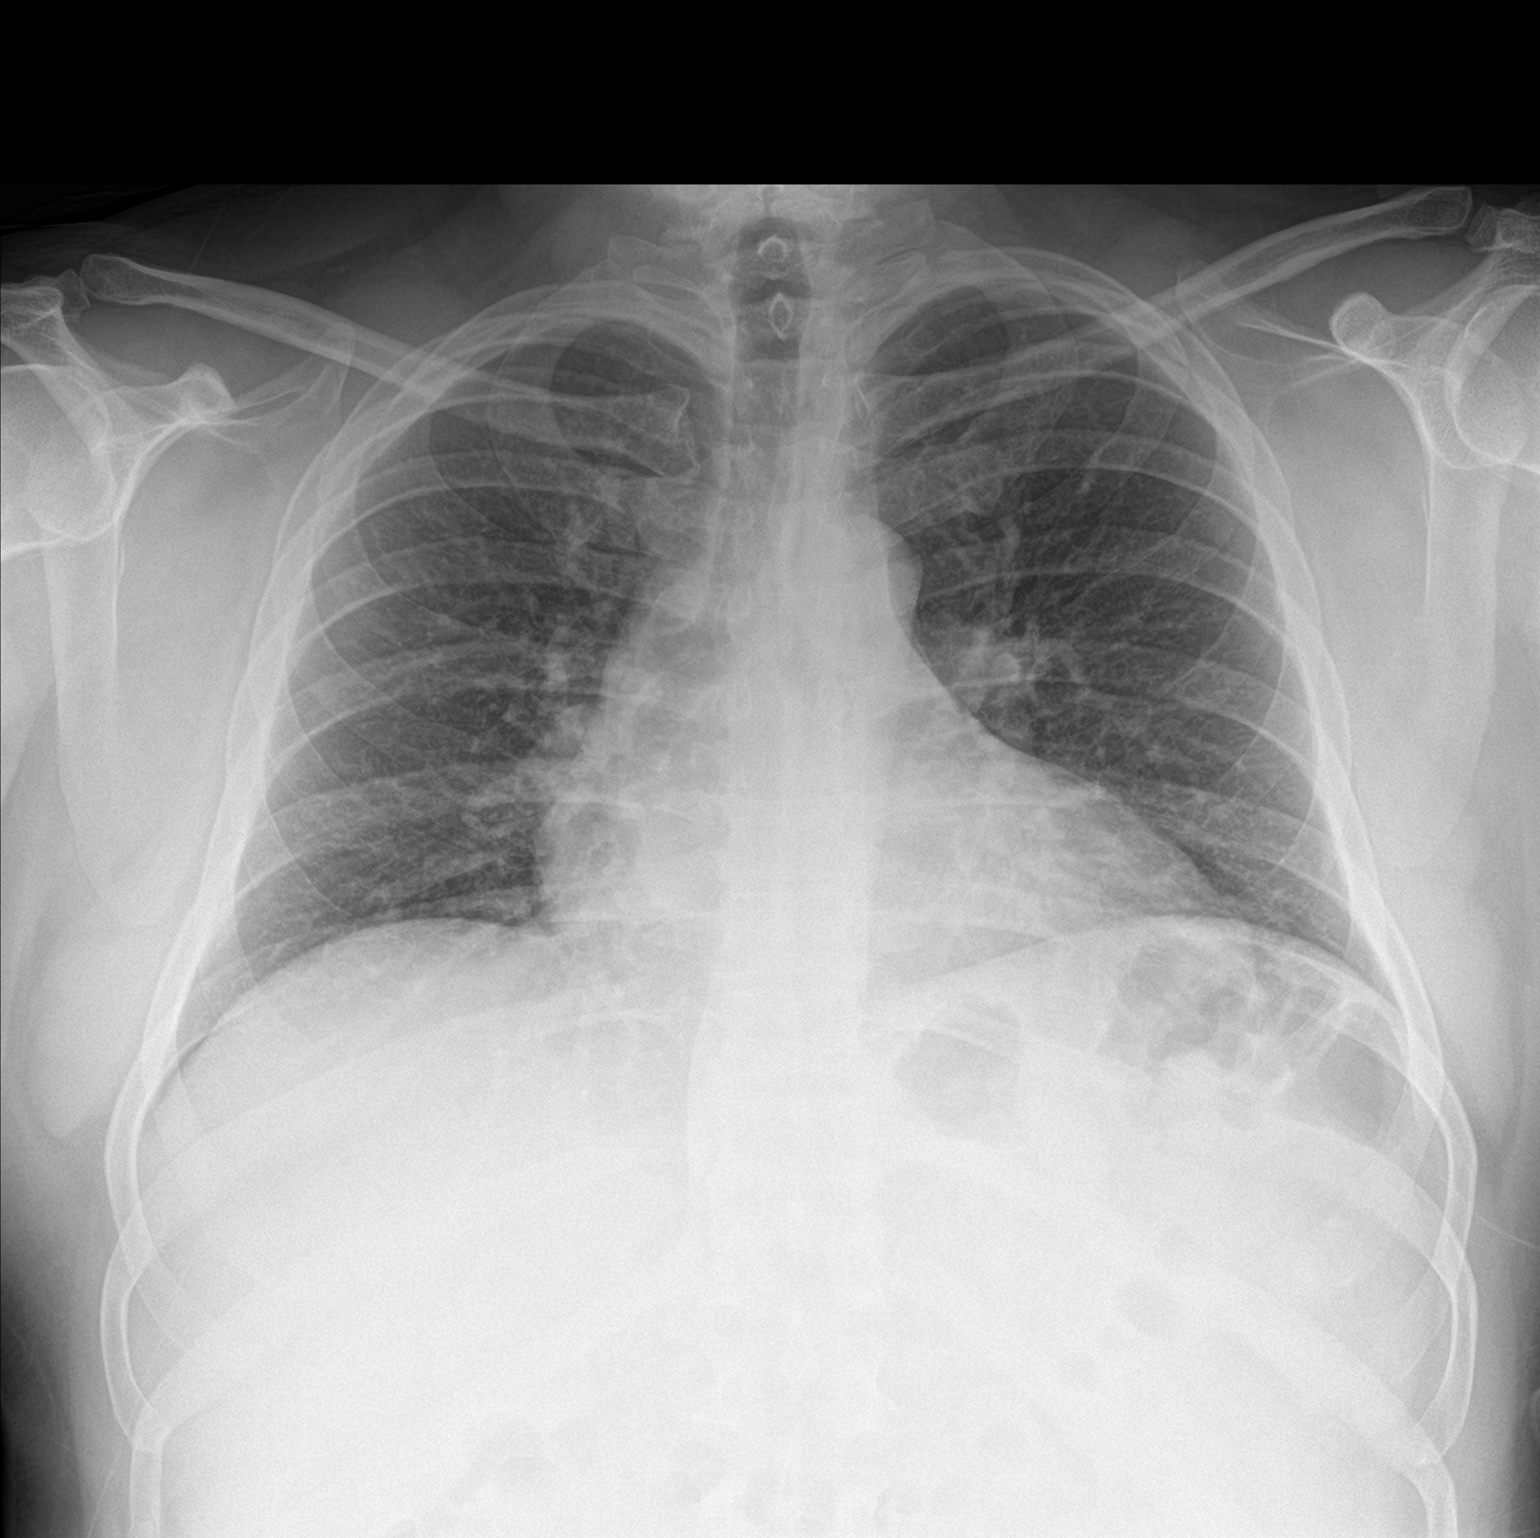

[chest lat]
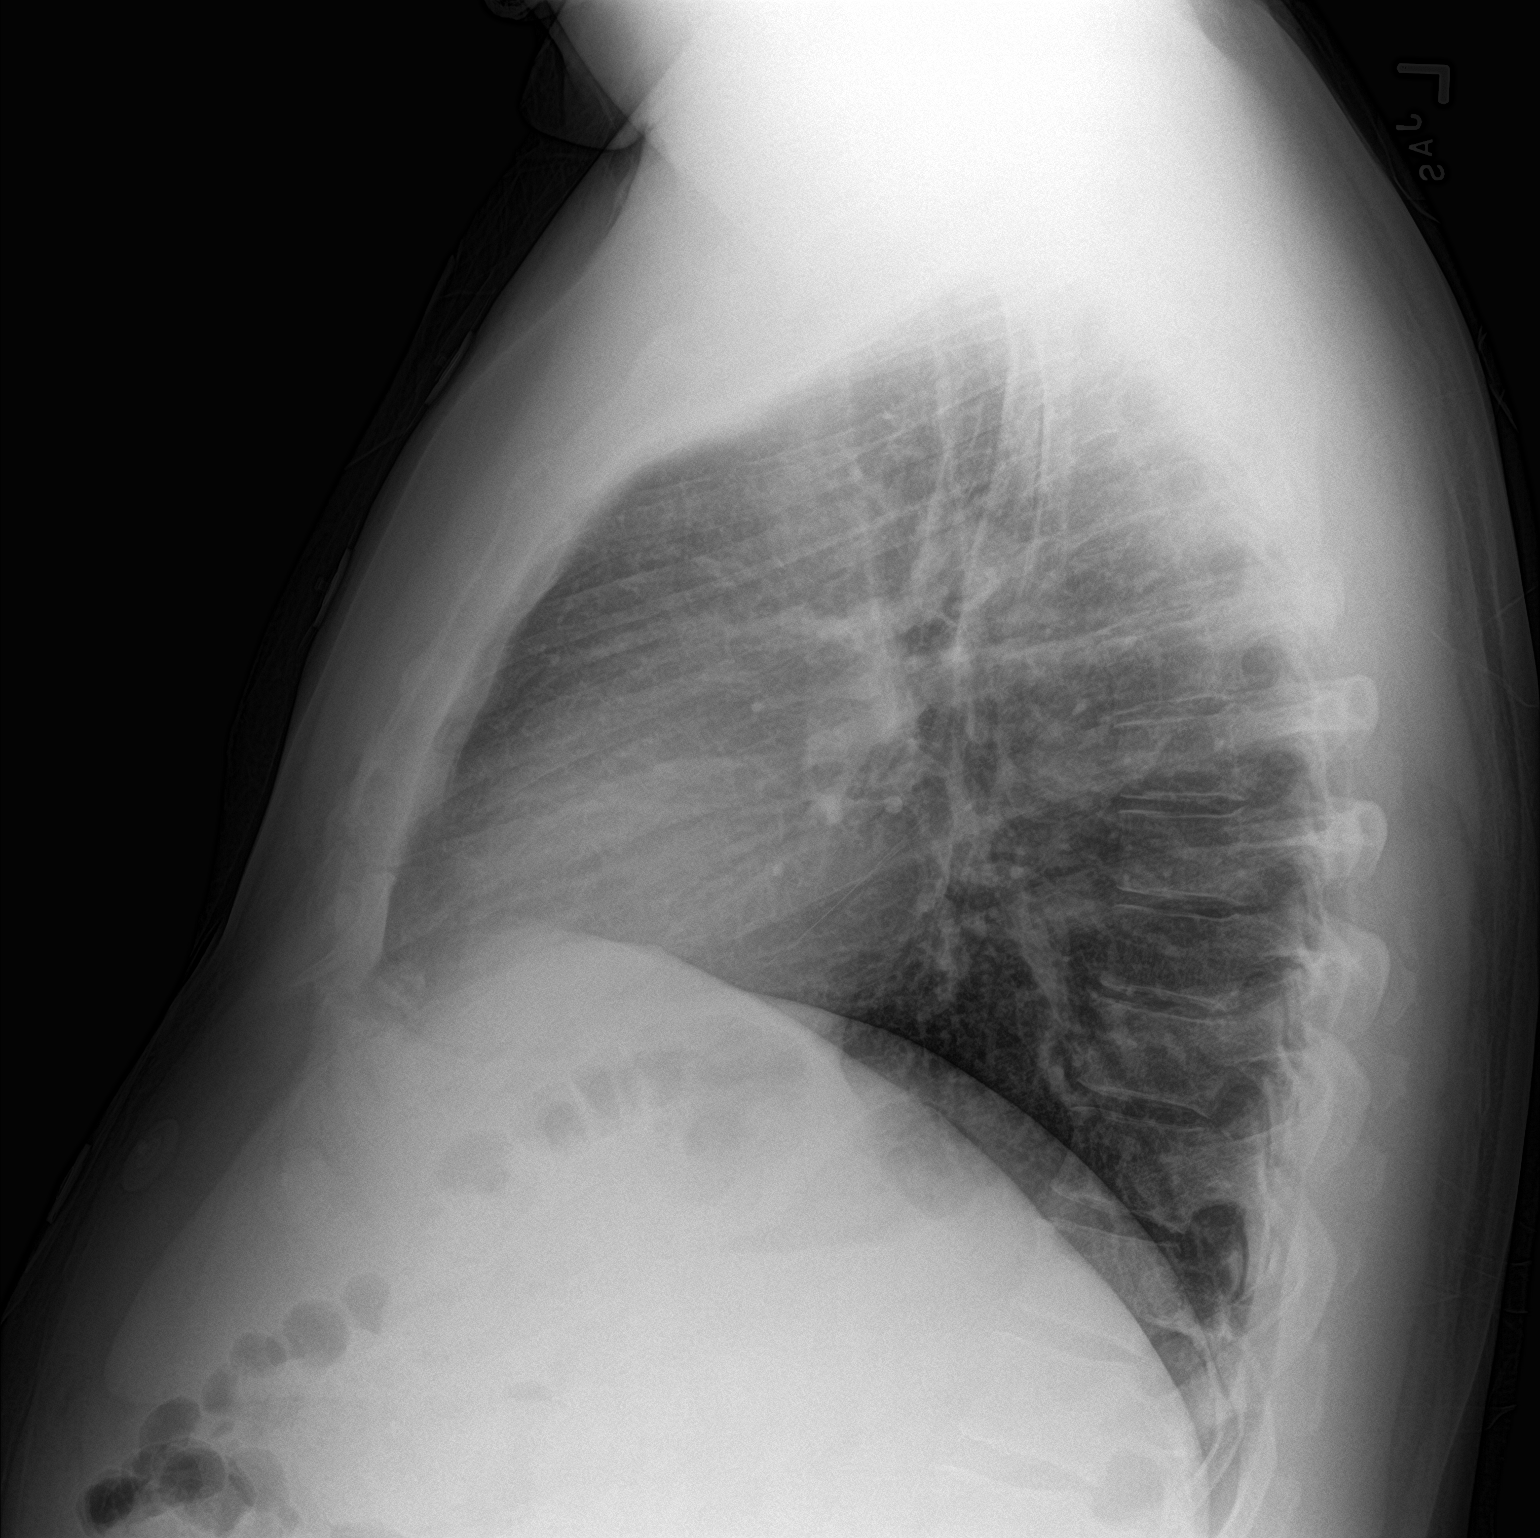

[2 of 2 positions shown; findings below may reference images not displayed]

FINDINGS: The lungs are well-aerated and clear. There is no evidence of focal
opacification, pleural effusion or pneumothorax.

The heart is borderline normal in size. No acute osseous
abnormalities are seen.
IMPRESSION: No acute cardiopulmonary process seen.

## 2020-07-17 IMAGING — DX DG CHEST 2V
2 series · 2 of 2 positions shown · non-contrast
Comparison: 05/06/2018

CLINICAL DATA: Cough and shortness of breath.  Fever.

EXAM:
CHEST - 2 VIEW

[chest pa]
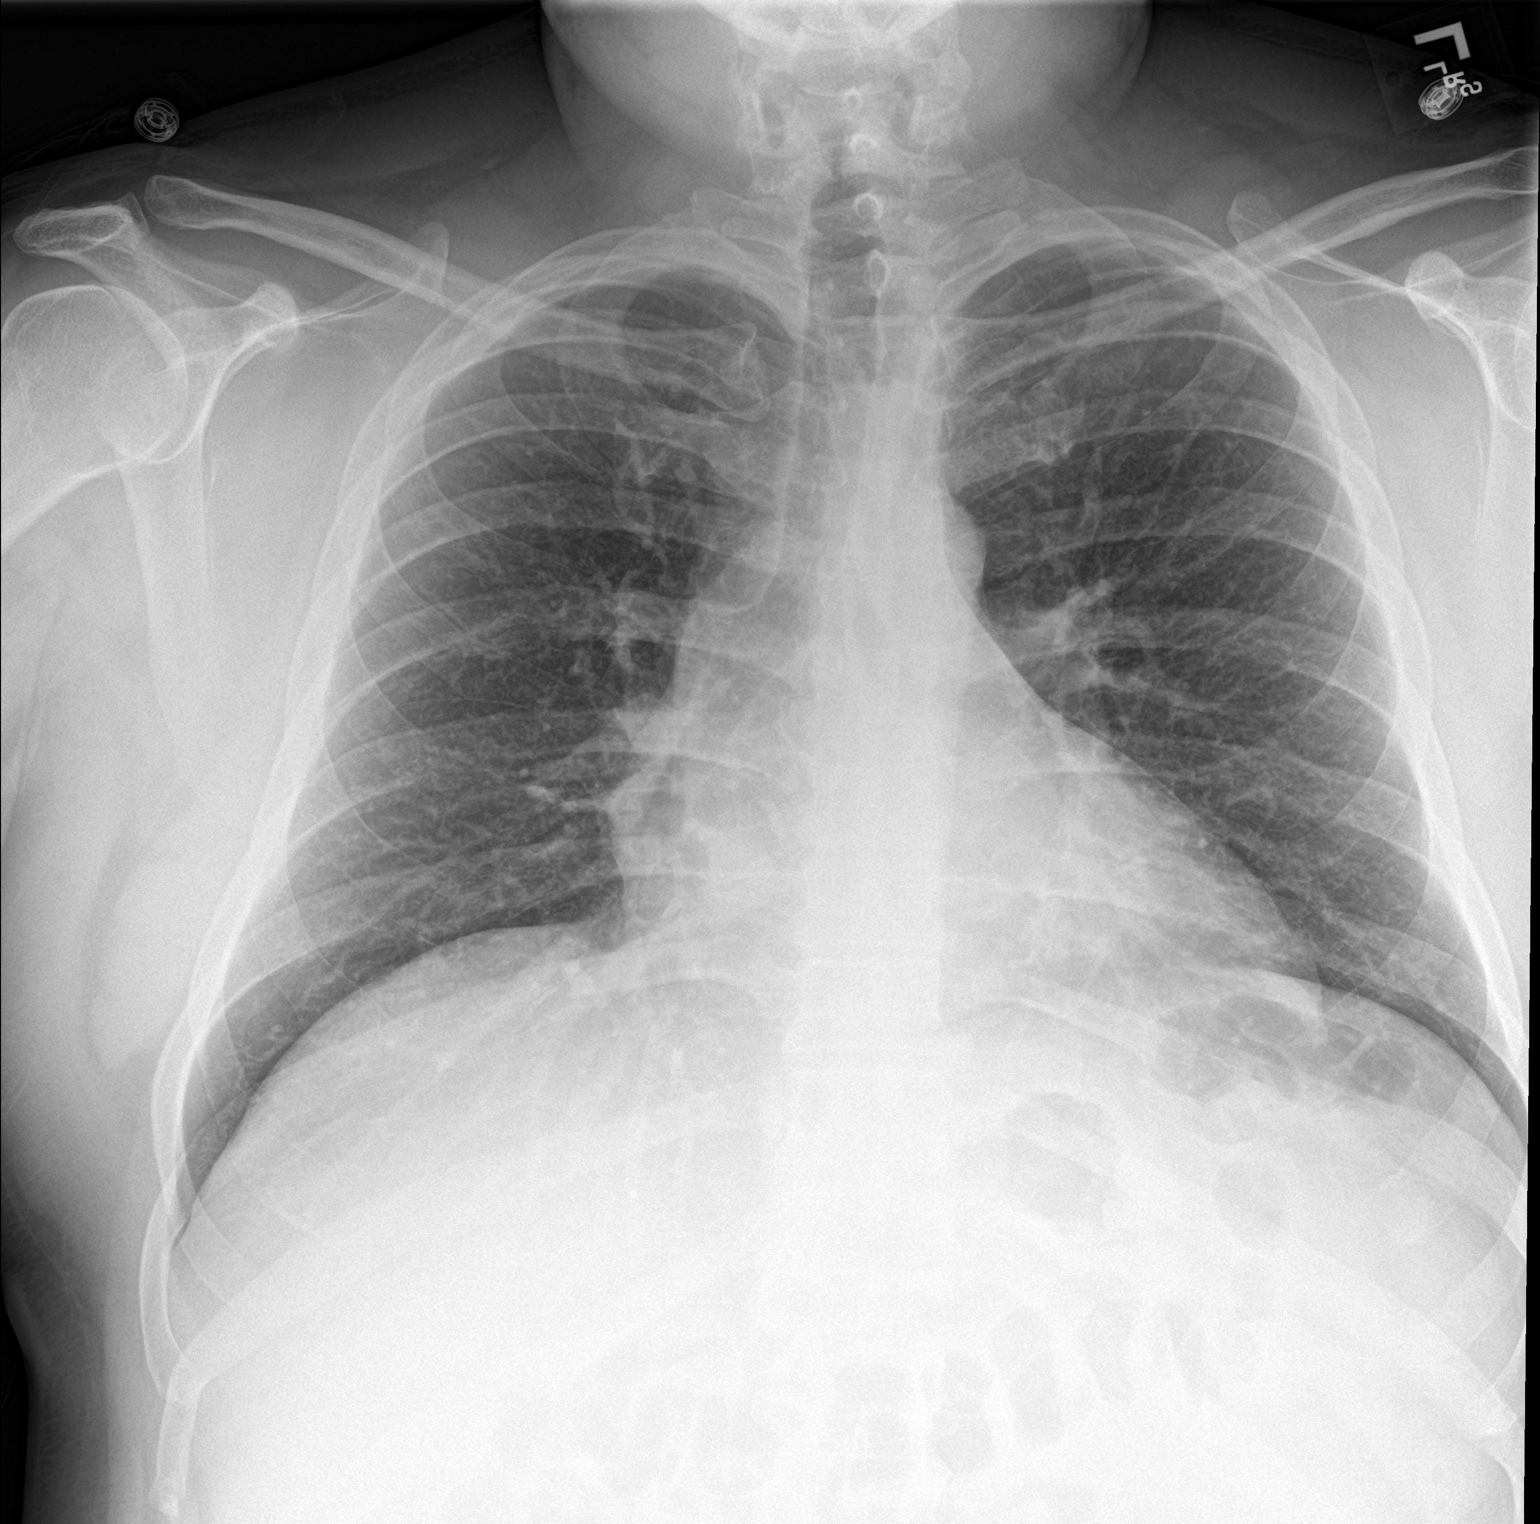

[chest lat]
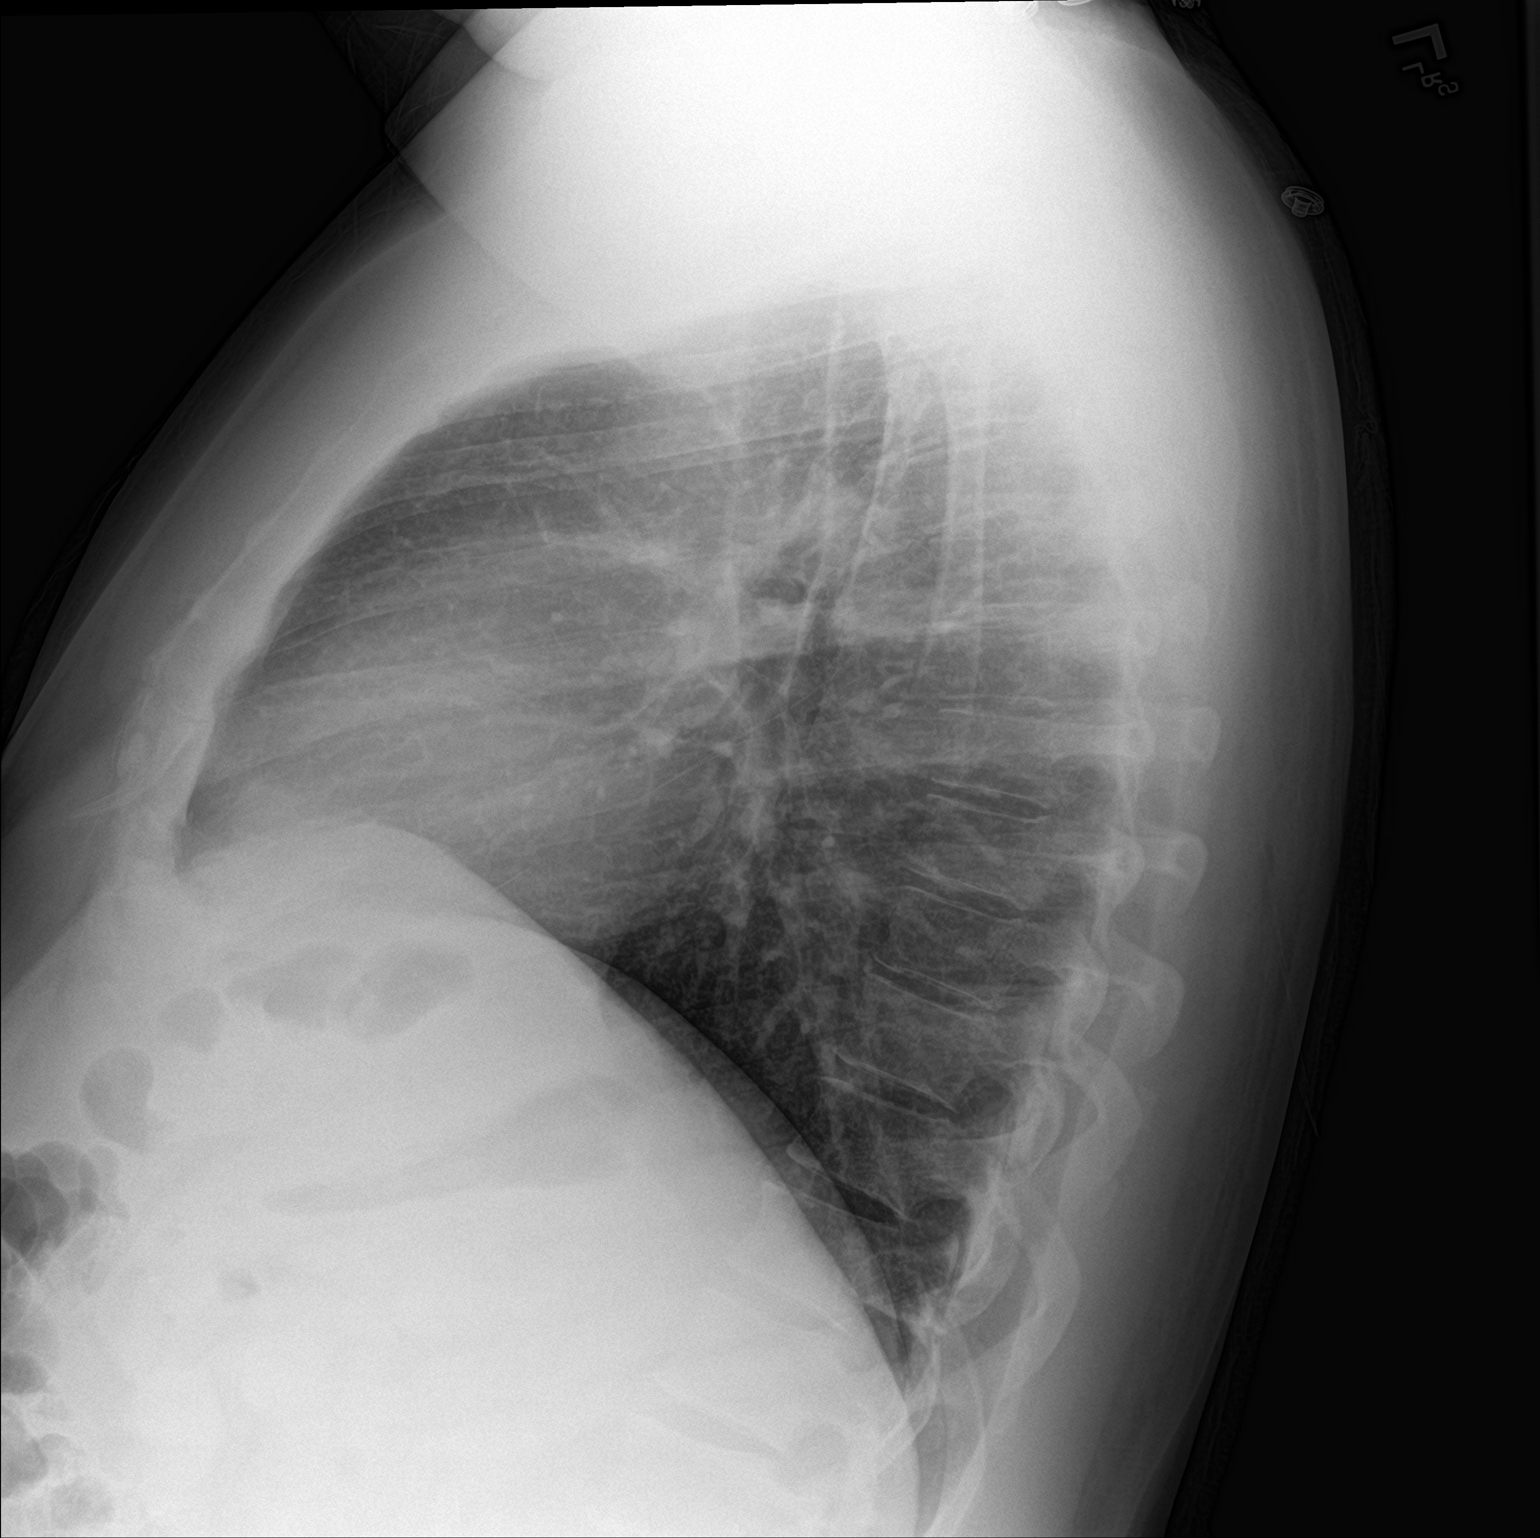

[2 of 2 positions shown; findings below may reference images not displayed]

FINDINGS: The heart size and mediastinal contours are within normal limits.
Both lungs are clear. The visualized skeletal structures are
unremarkable.
IMPRESSION: Normal exam.
